# Patient Record
Sex: Male | Born: 1965 | Race: White | Hispanic: No
Health system: Southern US, Community
[De-identification: ages and names within clinical notes are randomized; demographics above are authoritative.]

---

## 2018-07-24 ENCOUNTER — Emergency Department (HOSPITAL_COMMUNITY)
Admission: EM | Admit: 2018-07-24 | Discharge: 2018-07-24 | Disposition: A | Discharge status: Home / Self Care | Payer: 59 | Attending: Emergency Medicine | Admitting: Emergency Medicine

## 2018-07-24 ENCOUNTER — Emergency Department (HOSPITAL_COMMUNITY): Payer: 59

## 2018-07-24 DIAGNOSIS — W19XXXA Unspecified fall, initial encounter: Secondary | ICD-10-CM

## 2018-07-24 DIAGNOSIS — S0990XA Unspecified injury of head, initial encounter: Secondary | ICD-10-CM | POA: Diagnosis not present

## 2018-07-24 DIAGNOSIS — Y999 Unspecified external cause status: Secondary | ICD-10-CM | POA: Insufficient documentation

## 2018-07-24 DIAGNOSIS — Y92410 Unspecified street and highway as the place of occurrence of the external cause: Secondary | ICD-10-CM | POA: Diagnosis not present

## 2018-07-24 DIAGNOSIS — S060X9A Concussion with loss of consciousness of unspecified duration, initial encounter: Secondary | ICD-10-CM

## 2018-07-24 DIAGNOSIS — S069X9A Unspecified intracranial injury with loss of consciousness of unspecified duration, initial encounter: Secondary | ICD-10-CM | POA: Diagnosis not present

## 2018-07-24 DIAGNOSIS — Y939 Activity, unspecified: Secondary | ICD-10-CM | POA: Insufficient documentation

## 2018-07-24 MED ORDER — ONDANSETRON HCL 4 MG PO TABS: 4.0000 mg | ORAL_TABLET | Freq: Four times a day (QID) | ORAL | 0 refills | Status: AC
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
## 2018-07-24 NOTE — ED Notes (Signed)
Patient verbalizes understanding of discharge instructions. Opportunity for questioning and answers were provided. Armband removed by staff, pt discharged from ED home via POV.  

## 2018-07-24 NOTE — ED Notes (Signed)
Full rainbow, blue, light green, dark green, and purple, of blood tubes sent down to the lab to hold.
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
